# Patient Record
Sex: Female | Born: 1995 | Race: Asian | Hispanic: No | Marital: Single | Smoking: Never smoker
Health system: Southern US, Community
[De-identification: ages and names within clinical notes are randomized; demographics above are authoritative.]

---
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
## 2020-06-24 ENCOUNTER — Emergency Department (HOSPITAL_BASED_OUTPATIENT_CLINIC_OR_DEPARTMENT_OTHER)
Admission: EM | Admit: 2020-06-24 | Discharge: 2020-06-24 | Disposition: A | Discharge status: Home / Self Care | Payer: Managed Care, Other (non HMO) | Attending: Emergency Medicine | Admitting: Emergency Medicine

## 2020-06-24 ENCOUNTER — Encounter (HOSPITAL_BASED_OUTPATIENT_CLINIC_OR_DEPARTMENT_OTHER): Payer: Self-pay | Admitting: *Deleted

## 2020-06-24 ENCOUNTER — Other Ambulatory Visit: Payer: Self-pay

## 2020-06-24 DIAGNOSIS — J069 Acute upper respiratory infection, unspecified: Secondary | ICD-10-CM | POA: Diagnosis not present

## 2020-06-24 DIAGNOSIS — U071 COVID-19: Secondary | ICD-10-CM | POA: Insufficient documentation

## 2020-06-24 DIAGNOSIS — R059 Cough, unspecified: Secondary | ICD-10-CM | POA: Diagnosis present

## 2020-06-24 LAB — GROUP A STREP BY PCR: Group A Strep by PCR: NOT DETECTED

## 2020-06-24 NOTE — ED Triage Notes (Signed)
Cough sob and chest congestion x 3 days. She is a paramedic.

## 2020-06-24 NOTE — ED Provider Notes (Signed)
[facility] EMERGENCY DEPARTMENT Provider Note   CSN: [id number] Arrival date & time: 06/24/20  1157     History Chief Complaint  Patient presents with   Cough    Kelly Bryan is a 25 y.o. female.  HPI 25 year old female with no significant medical history presents with 3 days of cough, sore throat, weakness.  Not vaccinated for Covid.  No chest pain or shortness of breath, though triage notes note shortness of breath and chest congestion.  No known sick contacts.  No drooling, inability to swallow.  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  Wants to be ruled out for Covid.    History reviewed. No pertinent past medical history.  There are no problems to display for this patient.   History reviewed. No pertinent surgical history.   OB History   No obstetric history on file.     No family history on file.  Social History   Tobacco Use   Smoking status: Never Smoker   Smokeless tobacco: Never Used  Substance Use Topics   Alcohol use: No   Drug use: Never    Home Medications Prior to Admission medications   Medication Sig Start Date End Date Taking? Authorizing Provider  ibuprofen (ADVIL,MOTRIN) 800 MG tablet Take 1 tablet (800 mg total) by mouth 3 (three) times daily. 11/01/14   Joycie Peek, PA-C    Allergies    Patient has no known allergies.  Review of Systems   Review of Systems  Constitutional: Positive for activity change, appetite change, chills, fatigue and fever.  HENT: Positive for sore throat. Negative for congestion, ear pain, rhinorrhea, trouble swallowing and voice change.   Respiratory: Positive for cough. Negative for shortness of breath.   Cardiovascular: Negative for chest pain.  Gastrointestinal: Negative for abdominal pain, diarrhea, nausea and vomiting.  Genitourinary: Negative for dysuria.  Neurological: Positive for weakness.    Physical Exam Updated Vital Signs BP 119/71    Pulse 77    Temp 98.4 F (36.9 C) (Oral)    Resp 16     Ht 5\' 5"  (1.651 m)    Wt 55.6 kg    LMP 06/10/2020    SpO2 100%    BMI 20.40 kg/m   Physical Exam Vitals and nursing note reviewed.  Constitutional:      General: She is not in acute distress.    Appearance: She is well-developed and well-nourished. She is not ill-appearing, toxic-appearing or diaphoretic.  HENT:     Head: Normocephalic and atraumatic.     Mouth/Throat:     Mouth: Mucous membranes are moist.     Comments: Mild erythema to the posterior pharynx without exudates, uvula midline, no unilateral tonsillar swelling, tongue normal size and midline, no sublingual/submandibular swellimg, tolerating secretions well    Eyes:     Conjunctiva/sclera: Conjunctivae normal.  Cardiovascular:     Rate and Rhythm: Normal rate and regular rhythm.     Pulses: Normal pulses.     Heart sounds: No murmur heard.   Pulmonary:     Effort: Pulmonary effort is normal. No respiratory distress.     Breath sounds: Normal breath sounds.  Abdominal:     General: Abdomen is flat.     Palpations: Abdomen is soft.     Tenderness: There is no abdominal tenderness.  Musculoskeletal:        General: No edema. Normal range of motion.     Cervical back: Normal range of motion and neck supple.  Skin:  General: Skin is warm and dry.  Neurological:     General: No focal deficit present.     Mental Status: She is alert and oriented to person, place, and time.  Psychiatric:        Mood and Affect: Mood and affect normal.     ED Results / Procedures / Treatments   Labs (all labs ordered are listed, but only abnormal results are displayed) Labs Reviewed  GROUP A STREP BY PCR  SARS CORONAVIRUS 2 (TAT 6-24 HRS)    EKG None  Radiology No results found.  Procedures Procedures (including critical care time)  Medications Ordered in ED Medications - No data to display  ED Course  I have reviewed the triage vital signs and the nursing notes.  Pertinent labs & imaging results that were  available during my care of the patient were reviewed by me and considered in my medical decision making (see chart for details).    MDM Rules/Calculators/A&P                           Patients symptoms are consistent with URI, likely viral etiology. Consider COVID-19, COVID test sent.No known exposure. Patient is not vaccinated against COVID-19. Patient is afebrile, tolerating secretions. Strep negative.  Lungs clear to auscultation bilaterally. Normal O2 saturation. Pt will be discharged with symptomatic treatment, home isolation precautions.  Return precautions discussed. Verbalizes understanding and is agreeable with plan. Pt is hemodynamically stable & in NAD prior to dc.  Final Clinical Impression(s) / ED Diagnoses Final diagnoses:  Upper respiratory tract infection, unspecified type    Rx / DC Orders ED Discharge Orders    None       Leone Brand 06/24/20 1518    Pollyann Savoy, MD 06/29/20 [phone]

## 2020-06-24 NOTE — Discharge Instructions (Signed)
You were tested for COVID-19 today, make sure to quarantine until you receive the results. Your strep test was negative.  You may follow-up on the results via MyChart, there is information on your discharge paper cannot I download this app.  If this is positive, please make sure to quarantine additional 7 to 10 days. You may take ibuprofen/Tylenol for body aches and fevers, drink plenty of fluids, over-the-counter cold and flu medications. You may also buy an over-the-counter pulse oximeter which can be found at your local pharmacy. If your oxygen saturation drops below 90%, please make sure to return to the ER. Return to the ER for any worsening shortness of breath.

## 2020-06-25 LAB — SARS CORONAVIRUS 2 (TAT 6-24 HRS): SARS Coronavirus 2: POSITIVE — AB

## 2022-08-04 ENCOUNTER — Encounter (HOSPITAL_BASED_OUTPATIENT_CLINIC_OR_DEPARTMENT_OTHER): Payer: Self-pay | Admitting: Emergency Medicine

## 2022-08-04 ENCOUNTER — Emergency Department (HOSPITAL_BASED_OUTPATIENT_CLINIC_OR_DEPARTMENT_OTHER)
Admission: EM | Admit: 2022-08-04 | Discharge: 2022-08-04 | Disposition: A | Discharge status: Home / Self Care | Payer: BC Managed Care – PPO | Attending: Emergency Medicine | Admitting: Emergency Medicine

## 2022-08-04 ENCOUNTER — Other Ambulatory Visit: Payer: Self-pay

## 2022-08-04 ENCOUNTER — Emergency Department (HOSPITAL_BASED_OUTPATIENT_CLINIC_OR_DEPARTMENT_OTHER): Payer: BC Managed Care – PPO

## 2022-08-04 DIAGNOSIS — R55 Syncope and collapse: Secondary | ICD-10-CM | POA: Diagnosis present

## 2022-08-04 LAB — COMPREHENSIVE METABOLIC PANEL
ALT: 11 U/L (ref 0–44)
AST: 13 U/L — ABNORMAL LOW (ref 15–41)
Albumin: 4.1 g/dL (ref 3.5–5.0)
Alkaline Phosphatase: 40 U/L (ref 38–126)
Anion gap: 5 (ref 5–15)
BUN: 17 mg/dL (ref 6–20)
CO2: 24 mmol/L (ref 22–32)
Calcium: 8.5 mg/dL — ABNORMAL LOW (ref 8.9–10.3)
Chloride: 106 mmol/L (ref 98–111)
Creatinine, Ser: 0.65 mg/dL (ref 0.44–1.00)
GFR, Estimated: >60 mL/min (ref >60)
Glucose, Bld: 92 mg/dL (ref 70–99)
Potassium: 3.6 mmol/L (ref 3.5–5.1)
Sodium: 135 mmol/L (ref 135–145)
Total Bilirubin: 0.5 mg/dL (ref 0.3–1.2)
Total Protein: 7.2 g/dL (ref 6.5–8.1)

## 2022-08-04 LAB — CBC WITH DIFFERENTIAL/PLATELET
Abs Immature Granulocytes: 0.01 10*3/uL (ref 0.00–0.07)
Basophils Absolute: 0 10*3/uL (ref 0.0–0.1)
Basophils Relative: 0 %
Eosinophils Absolute: 0.1 10*3/uL (ref 0.0–0.5)
Eosinophils Relative: 2 %
HCT: 38.1 % (ref 36.0–46.0)
Hemoglobin: 12.9 g/dL (ref 12.0–15.0)
Immature Granulocytes: 0 %
Lymphocytes Relative: 43 %
Lymphs Abs: 3 10*3/uL (ref 0.7–4.0)
MCH: 30.6 pg (ref 26.0–34.0)
MCHC: 33.9 g/dL (ref 30.0–36.0)
MCV: 90.3 fL (ref 80.0–100.0)
Monocytes Absolute: 0.5 10*3/uL (ref 0.1–1.0)
Monocytes Relative: 7 %
Neutro Abs: 3.3 10*3/uL (ref 1.7–7.7)
Neutrophils Relative %: 48 %
Platelets: 264 10*3/uL (ref 150–400)
RBC: 4.22 MIL/uL (ref 3.87–5.11)
RDW: 11.9 % (ref 11.5–15.5)
WBC: 6.9 10*3/uL (ref 4.0–10.5)
nRBC: 0 % (ref 0.0–0.2)

## 2022-08-04 LAB — PREGNANCY, URINE: Preg Test, Ur: NEGATIVE

## 2022-08-04 LAB — URINALYSIS, ROUTINE W REFLEX MICROSCOPIC
Bilirubin Urine: NEGATIVE
Glucose, UA: NEGATIVE mg/dL
Hgb urine dipstick: NEGATIVE
Ketones, ur: NEGATIVE mg/dL
Leukocytes,Ua: NEGATIVE
Nitrite: NEGATIVE
Protein, ur: NEGATIVE mg/dL
Specific Gravity, Urine: 1.015 (ref 1.005–1.030)
pH: 6.5 (ref 5.0–8.0)

## 2022-08-04 NOTE — Discharge Instructions (Addendum)
Your laboratories also are within normal limits today.  You will need to follow-up with your primary care physician at your earliest convenience.  If you experience any worsening symptoms please return to the emergency department.
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
## 2022-08-04 NOTE — ED Provider Notes (Signed)
[facility] [place] [facility] AT [facility] [facility] Provider Note   CSN: [id number] Arrival date & time: 08/04/22  [id number]     History  Chief Complaint  Patient presents with   Loss of Consciousness    Kelly Bryan is a 27 y.o. female.  27 y.o female with no PMH presents to the ED with a chief complaint of syncope.  Patient reports she was at [place] [employer] approximately 2 days ago when she was sitting eating with a friend and started to feel weird, reports she stood up in went to the bathroom.  She then passed out and was out for approximately less than 5 seconds.  Reports prior to this her vision went blurry, the music got faint during fainter.  She reports she woke up on the ground and stood herself up.  She continued to feel somewhat dizzy therefore she sat on the toilet did not have a bowel movement then.  Reports that this episodes have been occurring in the past.  She did have 1 episode previously where she feels lightheaded when she sitting for a long period of time, every now and then she gets up and walks around and feels like this is what occurred.  She called her primary care physician who reported she needed to be evaluated in the emergency department and if anything was abnormal she would be scheduled for a sooner appointment.  Patient's last menstrual cycle unknown.  Denies any chest pain, no shortness of breath, no prior history of aneurysms.  The history is provided by the patient.  Loss of Consciousness Associated symptoms: no chest pain, no fever, no headaches, no nausea, no shortness of breath and no vomiting        Home Medications Prior to Admission medications   Medication Sig Start Date End Date Taking? Authorizing Provider  ibuprofen (ADVIL,MOTRIN) 800 MG tablet Take 1 tablet (800 mg total) by mouth 3 (three) times daily. 11/01/14   Comer Locket, PA-C      Allergies    Patient has no known allergies.    Review of Systems   Review of Systems   Constitutional:  Negative for chills and fever.  HENT:  Negative for sore throat.   Respiratory:  Negative for shortness of breath.   Cardiovascular:  Positive for syncope. Negative for chest pain.  Gastrointestinal:  Negative for abdominal pain, nausea and vomiting.  Genitourinary:  Negative for flank pain.  Musculoskeletal:  Negative for back pain.  Neurological:  Positive for light-headedness. Negative for headaches.  All other systems reviewed and are negative.   Physical Exam Updated Vital Signs BP (!) 125/94 (BP Location: Left Arm)   Pulse 68   Temp 98.2 F (36.8 C) (Oral)   Resp 18   Ht 5' 6"$  (1.676 m)   Wt 56.7 kg   LMP 07/14/2022 (Approximate)   SpO2 100%   BMI 20.18 kg/m  Physical Exam Vitals and nursing note reviewed.  Constitutional:      General: She is not in acute distress.    Appearance: She is well-developed.  HENT:     Head: Normocephalic and atraumatic.     Mouth/Throat:     Pharynx: No oropharyngeal exudate.  Eyes:     Pupils: Pupils are equal, round, and reactive to light.  Cardiovascular:     Rate and Rhythm: Regular rhythm.     Heart sounds: Normal heart sounds.  Pulmonary:     Effort: Pulmonary effort is normal. No respiratory distress.  Breath sounds: Normal breath sounds.  Abdominal:     General: Bowel sounds are normal. There is no distension.     Palpations: Abdomen is soft.     Tenderness: There is no abdominal tenderness.  Musculoskeletal:        General: No tenderness or deformity.     Cervical back: Normal range of motion.     Right lower leg: No edema.     Left lower leg: No edema.  Skin:    General: Skin is warm and dry.  Neurological:     Mental Status: She is alert and oriented to person, place, and time.     Comments: Alert, oriented, thought content appropriate. Speech fluent without evidence of aphasia. Able to follow 2 step commands without difficulty.  Cranial Nerves:  II:  Peripheral visual fields grossly normal,  pupils, round, reactive to light III,IV, VI: ptosis not present, extra-ocular motions intact bilaterally  V,VII: smile symmetric, facial light touch sensation equal VIII: hearing grossly normal bilaterally  IX,X: midline uvula rise  XI: bilateral shoulder shrug equal and strong XII: midline tongue extension  Motor:  5/5 in upper and lower extremities bilaterally including strong and equal grip strength and dorsiflexion/plantar flexion Sensory: light touch normal in all extremities.  Cerebellar: normal finger-to-nose with bilateral upper extremities, pronator drift negative Gait: normal gait and balance       ED Results / Procedures / Treatments   Labs (all labs ordered are listed, but only abnormal results are displayed) Labs Reviewed  COMPREHENSIVE METABOLIC PANEL - Abnormal; Notable for the following components:      Result Value   Calcium 8.5 (*)    AST 13 (*)    All other components within normal limits  CBC WITH DIFFERENTIAL/PLATELET  PREGNANCY, URINE  URINALYSIS, ROUTINE W REFLEX MICROSCOPIC    EKG EKG Interpretation  Date/Time:  Friday August 04 2022 19:40:15 EST Ventricular Rate:  66 PR Interval:  76 QRS Duration: 75 QT Interval:  407 QTC Calculation: 427 R Axis:   71 Text Interpretation: Sinus rhythm Short PR interval Confirmed by Lennice Sites ([id number]) on 08/04/2022 7:52:33 PM  Radiology DG Chest 2 View  Result Date: 08/04/2022 CLINICAL DATA:  Recent syncopal episode EXAM: CHEST - 2 VIEW COMPARISON:  11/01/2014 FINDINGS: The heart size and mediastinal contours are within normal limits. Both lungs are clear. The visualized skeletal structures are unremarkable. IMPRESSION: No active cardiopulmonary disease. Electronically Signed   By: Inez Catalina M.D.   On: 08/04/2022 20:28    Procedures Procedures    Medications Ordered in ED Medications - No data to display  ED Course/ Medical Decision Making/ A&P                             Medical Decision  Making Amount and/or Complexity of Data Reviewed Labs: ordered. Radiology: ordered.   This patient presents to the ED for concern of syncope, this involves a number of treatment options, and is a complaint that carries with it a high risk of complications and morbidity.  The differential diagnosis includes ectopic pregnancy, PE versus vasovagal.    Co morbidities: Discussed in HPI   Brief History:  Patient with no underlying medical history presents to the ED status post syncope which occurred approximately 2 days ago.  Reports she called her PCP who instructed her to the emergency department, as she cannot see her until the month of May.  Patient reports these episodes  have not happened in the past however she does get lightheaded and when she sitting down for long period of time she feels like she is pretty dizzy upon standing.  Last menstrual cycle is unknown.  Has not taken anything for her symptoms.  Has no complaints at the symptoms of chest pain, shortness of breath, prior history of blood clots, prior history of aneurysms.  EMR reviewed including pt PMHx, past surgical history and past visits to ER.   See HPI for more details   Lab Tests:  I ordered and independently interpreted labs.  The pertinent results include:    I personally reviewed all laboratory work and imaging. Metabolic panel without any acute abnormality specifically kidney function within normal limits and no significant electrolyte abnormalities. CBC without leukocytosis or significant anemia.  UA with no signs of infection.  Pregnancy test is negative.   Imaging Studies:  NAD. I personally reviewed all imaging studies and no acute abnormality found. I agree with radiology interpretation.  Cardiac Monitoring:  The patient was maintained on a cardiac monitor.  I personally viewed and interpreted the cardiac monitored which showed an underlying rhythm of: NSR 66 EKG non-ischemic   Medicines  ordered:  N/A  Reevaluation:  After the interventions noted above I re-evaluated patient and found that they have :stayed the same   Social Determinants of Health:  The patient's social determinants of health were a factor in the care of this patient  Problem List / ED Course:  Patient presents to the ED status post syncopal episode which occurred 2 days ago.  Called her PCP who instructed her to be evaluated in the emergency department.  Patient reports there was no presyncopal symptoms such as chest pain, shortness of breath.  She does not have any history of CAD, she is currently on no OCPs.  She does report she feels lightheaded sometimes when she goes from sitting to standing some suspicion for vasovagal etiology.  Interpretation of her labs similarly a CBC with no leukocytosis, hemoglobin is within normal limits.  CMP without any electrolyte derangement, creatinine levels within normal limits.  LFTs are unremarkable.  UA without any nitrites or leukocytes to suggest infection.  Pregnancy test is negative, I do not suspect ectopic pregnancy at this time.  EKG was normal sinus rhythm, no delta wave noted, I do not suspect cardiac etiology without any chest pain.  Chest x-ray without any signs of acute finding versus pneumonia.  Orthostatic vital signs were obtained which were negative.  Patient does not have any complaint, no source of infection noted.  She is PERC negative.  No prior OCPs, no prior history of blood clots or back disease.  I do feel that patient is appropriate for outpatient follow-up with her primary care physician.  She is agreeable to plan and treatment.  Patient hemodynamically stable for discharge.   Dispostion:  After consideration of the diagnostic results and the patients response to treatment, I feel that the patent would benefit from outpatient follow up with primary care physician.    Portions of this note were generated with [employer]. Dictation  errors may occur despite best attempts at proofreading.   Final Clinical Impression(s) / ED Diagnoses Final diagnoses:  Syncope and collapse    Rx / DC Orders ED Discharge Orders     None         Janeece Fitting, Hershal Coria 08/04/22 2137    Lennice Sites, DO 08/04/22 2239
